# Patient Record
Sex: Female | Born: 1975 | Hispanic: Yes | Smoking: Current some day smoker
Health system: Southern US, Community
[De-identification: ages and names within clinical notes are randomized; demographics above are authoritative.]

## PROBLEM LIST (undated history)

## (undated) DIAGNOSIS — E119 Type 2 diabetes mellitus without complications: Secondary | ICD-10-CM

## (undated) DIAGNOSIS — E7800 Pure hypercholesterolemia, unspecified: Secondary | ICD-10-CM

## (undated) HISTORY — PX: OTHER SURGICAL HISTORY: SHX169

---

## 2009-08-18 ENCOUNTER — Emergency Department: Payer: Self-pay | Admitting: Emergency Medicine

## 2016-06-15 ENCOUNTER — Ambulatory Visit: Payer: Self-pay | Attending: Oncology

## 2016-06-15 ENCOUNTER — Ambulatory Visit
Admission: RE | Admit: 2016-06-15 | Discharge: 2016-06-15 | Disposition: A | Discharge status: Home / Self Care | Payer: Self-pay | Source: Ambulatory Visit | Attending: Oncology | Admitting: Oncology

## 2016-06-15 ENCOUNTER — Encounter (INDEPENDENT_AMBULATORY_CARE_PROVIDER_SITE_OTHER): Payer: Self-pay

## 2016-06-15 VITALS — BP 125/75 | HR 72 | Temp 96.9°F | Resp 18 | Ht 60.24 in | Wt 166.1 lb

## 2016-06-15 DIAGNOSIS — N6452 Nipple discharge: Secondary | ICD-10-CM

## 2016-06-15 NOTE — Progress Notes (Signed)
Subjective:     Patient ID: Stephanie Boyer, female   DOB: Jun 26, 1975, 41 y.o.   MRN: [id number]  HPI   Review of Systems     Objective:   Physical Exam  Pulmonary/Chest: Right breast exhibits nipple discharge. Right breast exhibits no inverted nipple, no mass, no skin change and no tenderness. Left breast exhibits nipple discharge. Left breast exhibits no inverted nipple, no mass, no skin change and no tenderness. Breasts are symmetrical.  Clear-straw colored bilateral nipple discharge       Assessment:     41 year old hispanic patient presents for [facility] clinic visit.  Patient screened, and meets [facility] eligibility.  Patient does not have insurance, Medicare or Medicaid.  Handout given on Affordable Care Act.  Instructed patient on breast self-exam using teach back method.  Patient complains of right nipple pain that caused her to express discharge. Also having left breast discharge on expression, but no pain.  Able to express clear to straw colored discharge bilateral.    Plan:     Sent for bilateral diagnostic mammogram, and ultrasound.

## 2016-06-28 NOTE — Progress Notes (Signed)
Radiologist reported negative mammogram findings to patient.  Letter mailed from [facility] to notify of normal mammogram results.  Patient to return in one year for annual screening Copy to [facility].

## 2017-09-09 ENCOUNTER — Encounter: Payer: Self-pay | Admitting: Emergency Medicine

## 2017-09-09 ENCOUNTER — Emergency Department
Admission: EM | Admit: 2017-09-09 | Discharge: 2017-09-09 | Disposition: A | Discharge status: Home / Self Care | Payer: Self-pay | Attending: Emergency Medicine | Admitting: Emergency Medicine

## 2017-09-09 ENCOUNTER — Emergency Department: Payer: Self-pay

## 2017-09-09 ENCOUNTER — Other Ambulatory Visit: Payer: Self-pay

## 2017-09-09 DIAGNOSIS — F17200 Nicotine dependence, unspecified, uncomplicated: Secondary | ICD-10-CM | POA: Insufficient documentation

## 2017-09-09 DIAGNOSIS — M25552 Pain in left hip: Secondary | ICD-10-CM

## 2017-09-09 DIAGNOSIS — M5417 Radiculopathy, lumbosacral region: Secondary | ICD-10-CM | POA: Insufficient documentation

## 2017-09-09 LAB — POCT PREGNANCY, URINE: Preg Test, Ur: NEGATIVE

## 2017-09-09 MED ORDER — ORPHENADRINE CITRATE 30 MG/ML IJ SOLN
60.0000 mg | Freq: Two times a day (BID) | INTRAMUSCULAR | Status: DC
  Administered 2017-09-09: 60 mg via INTRAMUSCULAR
  Filled 2017-09-09: qty 2

## 2017-09-09 MED ORDER — LIDOCAINE 5 % EX PTCH
1.0000 | MEDICATED_PATCH | CUTANEOUS | Status: DC
  Administered 2017-09-09: 1 via TRANSDERMAL
  Filled 2017-09-09: qty 1

## 2017-09-09 MED ORDER — KETOROLAC TROMETHAMINE 10 MG PO TABS
10.0000 mg | ORAL_TABLET | Freq: Four times a day (QID) | ORAL | 0 refills | Status: AC | PRN
Start: 2017-09-09 — End: ?

## 2017-09-09 MED ORDER — LIDOCAINE 5 % EX PTCH: 1.0000 | MEDICATED_PATCH | Freq: Two times a day (BID) | CUTANEOUS | 0 refills | Status: AC

## 2017-09-09 MED ORDER — CYCLOBENZAPRINE HCL 5 MG PO TABS: ORAL_TABLET | ORAL | 0 refills | Status: AC

## 2017-09-09 MED ORDER — KETOROLAC TROMETHAMINE 30 MG/ML IJ SOLN
30.0000 mg | Freq: Once | INTRAMUSCULAR | Status: AC
  Administered 2017-09-09: 30 mg via INTRAMUSCULAR
  Filled 2017-09-09: qty 1

## 2017-09-09 MED ORDER — OXYCODONE-ACETAMINOPHEN 5-325 MG PO TABS
1.0000 | ORAL_TABLET | Freq: Once | ORAL | Status: AC
  Administered 2017-09-09: 1 via ORAL
  Filled 2017-09-09: qty 1

## 2017-09-09 MED ORDER — TRAMADOL HCL 50 MG PO TABS: 50.0000 mg | ORAL_TABLET | Freq: Four times a day (QID) | ORAL | 0 refills | Status: AC | PRN

## 2017-09-09 NOTE — ED Notes (Signed)
This RN called XR to inform them pt's POC is negative and was entered approx 1144. Aware. Per radiology, pt is next on the list for XR.

## 2017-09-09 NOTE — ED Notes (Signed)
Patient transported to XR. 

## 2017-09-09 NOTE — ED Triage Notes (Addendum)
States pian L hip radiating down L leg x 2 weeks with no fall or injury.

## 2017-09-09 NOTE — ED Provider Notes (Signed)
[facility] Emergency Department Provider Note  ____________________________________________  Time seen: Approximately 10:54 AM  I have reviewed the triage vital signs and the nursing notes.   HISTORY  Chief Complaint Knee Pain and Hip Pain    HPI Stephanie Boyer is a 42 y.o. female that presents to the emergency department for evaluation of left hip pain that radiates into front of left thigh for 2 weeks. She has had some occasional numbness in her toes. No trauma.  No bowel or bladder dysfunction or saddle paresthesias.  Patient denies abdominal pain, back pain, dysuria, urgency, frequency.   History reviewed. No pertinent past medical history.  There are no active problems to display for this patient.   Past Surgical History:  Procedure Laterality Date  . hypercholesterolemia      Prior to Admission medications   Medication Sig Start Date End Date Taking? Authorizing Provider  cyclobenzaprine (FLEXERIL) 5 MG tablet Take 1-2 tablets 3 times daily as needed 09/09/17   Enid DerryWagner, Blayke Pinera, PA-C  ketorolac (TORADOL) 10 MG tablet Take 1 tablet (10 mg total) by mouth every 6 (six) hours as needed. 09/09/17   Enid DerryWagner, Denver Bentson, PA-C  lidocaine (LIDODERM) 5 % Place 1 patch onto the skin every 12 (twelve) hours. Remove & Discard patch within 12 hours or as directed by MD 09/09/17 09/09/18  Enid DerryWagner, Arminta Gamm, PA-C  traMADol (ULTRAM) 50 MG tablet Take 1 tablet (50 mg total) by mouth every 6 (six) hours as needed. 09/09/17 09/09/18  Enid DerryWagner, Volney Reierson, PA-C    Allergies Patient has no known allergies.  No family history on file.  Social History Social History   Tobacco Use  . Smoking status: Current Some Day Smoker  Substance Use Topics  . Alcohol use: Not on file  . Drug use: Not on file     Review of Systems  Constitutional: No fever/chills Cardiovascular: No chest pain. Respiratory: No SOB. Gastrointestinal: No abdominal pain.  No nausea, no vomiting.   Genitourinary: Negative for dysuria. Musculoskeletal: Positive for hip pain. Negative for back pain.  Skin: Negative for rash, abrasions, lacerations, ecchymosis. Neurological: Negative for headaches  ____________________________________________   PHYSICAL EXAM:  VITAL SIGNS: ED Triage Vitals  Enc Vitals Group     BP 09/09/17 0955 (!) 152/85     Pulse Rate 09/09/17 0955 75     Resp 09/09/17 0955 18     Temp 09/09/17 0955 98 F (36.7 C)     Temp Source 09/09/17 0955 Oral     SpO2 09/09/17 0955 98 %     Weight 09/09/17 0956 169 lb (76.7 kg)     Height 09/09/17 0956 5' (1.524 m)     Head Circumference --      Peak Flow --      Pain Score 09/09/17 0956 9     Pain Loc --      Pain Edu? --      Excl. in GC? --      Constitutional: Alert and oriented. Well appearing and in no acute distress. Eyes: Conjunctivae are normal. PERRL. EOMI. Head: Atraumatic. ENT:      Ears:      Nose: No congestion/rhinnorhea.      Mouth/Throat: Mucous membranes are moist.  Neck: No stridor.  Cardiovascular: Normal rate, regular rhythm.  Good peripheral circulation. Respiratory: Normal respiratory effort without tachypnea or retractions. Lungs CTAB. Good air entry to the bases with no decreased or absent breath sounds. Gastrointestinal: Bowel sounds 4 quadrants. Soft and nontender to  palpation. No guarding or rigidity. No palpable masses. No distention. No CVA tenderness. Musculoskeletal: Full range of motion to all extremities. No gross deformities appreciated.  No tenderness to palpation over lumbar spine.  Tenderness to palpation of her left hip and left lumbar paraspinal muscles.  Full range of motion of bilateral hips.  Strength equal in lower extremity's bilaterally.  Negative straight leg raise. Neurologic:  Normal speech and language. No gross focal neurologic deficits are appreciated.  Skin:  Skin is warm, dry and intact. No rash noted. Psychiatric: Mood and affect are normal. Speech and  behavior are normal. Patient exhibits appropriate insight and judgement.   ____________________________________________   LABS (all labs ordered are listed, but only abnormal results are displayed)  Labs Reviewed  POC URINE PREG, ED  POCT PREGNANCY, URINE   ____________________________________________  EKG   ____________________________________________  RADIOLOGY Lexine Baton, personally viewed and evaluated these images (plain radiographs) as part of my medical decision making, as well as reviewing the written report by the radiologist.  Dg Hip Unilat W Or Wo Pelvis 2-3 Views Left  Result Date: 09/09/2017 CLINICAL DATA:  Pain with left-sided radicular symptoms EXAM: DG HIP (WITH OR WITHOUT PELVIS) 2-3V LEFT COMPARISON:  None. FINDINGS: Frontal pelvis as well as frontal and lateral left hip images were obtained. No fracture or dislocation. Joint spaces appear normal. No erosive change. IMPRESSION: No fracture or dislocation.  No evident arthropathy. Electronically Signed   By: Bretta Bang III M.D.   On: 09/09/2017 12:53    ____________________________________________    PROCEDURES  Procedure(s) performed:    Procedures    Medications  lidocaine (LIDODERM) 5 % 1 patch (1 patch Transdermal Patch Applied 09/09/17 1119)  orphenadrine (NORFLEX) injection 60 mg (60 mg Intramuscular Given 09/09/17 1341)  oxyCODONE-acetaminophen (PERCOCET/ROXICET) 5-325 MG per tablet 1 tablet (1 tablet Oral Given 09/09/17 1117)  ketorolac (TORADOL) 30 MG/ML injection 30 mg (30 mg Intramuscular Given 09/09/17 1338)     ____________________________________________   INITIAL IMPRESSION / ASSESSMENT AND PLAN / ED COURSE  Pertinent labs & imaging results that were available during my care of the patient were reviewed by me and considered in my medical decision making (see chart for details).  Review of the [place] CSRS was performed in accordance of the [facility] prior to dispensing any controlled  drugs.     Patient's diagnosis is consistent with hip pain and radiculopathy. Vital signs and exam are reassuring.  Hip x-ray negative for acute bony abnormalities. Patient states the pain completely resolved with Percocet, Toradol, Norflex. Patient will be discharged home with prescriptions for toradol, flexeril, tramadol, lidoderm. Patient is to follow up with PCP as directed. Patient is given ED precautions to return to the ED for any worsening or new symptoms.     ____________________________________________  FINAL CLINICAL IMPRESSION(S) / ED DIAGNOSES  Final diagnoses:  Left hip pain  Lumbosacral radiculopathy      NEW MEDICATIONS STARTED DURING THIS VISIT:  ED Discharge Orders        Ordered    ketorolac (TORADOL) 10 MG tablet  Every 6 hours PRN     09/09/17 1413    cyclobenzaprine (FLEXERIL) 5 MG tablet     09/09/17 1413    traMADol (ULTRAM) 50 MG tablet  Every 6 hours PRN     09/09/17 1413    lidocaine (LIDODERM) 5 %  Every 12 hours     09/09/17 1413          This chart was  dictated using voice recognition software/Dragon. Despite best efforts to proofread, errors can occur which can change the meaning. Any change was purely unintentional.    Enid Derry, PA-C 09/09/17 1557    Pershing Proud Myra Rude, MD 09/12/17 2142

## 2019-01-16 ENCOUNTER — Other Ambulatory Visit: Payer: Self-pay

## 2019-01-16 DIAGNOSIS — Z20822 Contact with and (suspected) exposure to covid-19: Secondary | ICD-10-CM

## 2019-01-18 LAB — NOVEL CORONAVIRUS, NAA: SARS-CoV-2, NAA: DETECTED — AB

## 2019-01-30 ENCOUNTER — Other Ambulatory Visit: Payer: Self-pay

## 2019-01-30 DIAGNOSIS — Z20822 Contact with and (suspected) exposure to covid-19: Secondary | ICD-10-CM

## 2019-02-01 LAB — NOVEL CORONAVIRUS, NAA: SARS-CoV-2, NAA: NOT DETECTED

## 2020-02-14 ENCOUNTER — Other Ambulatory Visit: Payer: Self-pay | Admitting: Family Medicine

## 2020-02-14 DIAGNOSIS — Z1231 Encounter for screening mammogram for malignant neoplasm of breast: Secondary | ICD-10-CM

## 2020-02-20 ENCOUNTER — Ambulatory Visit: Payer: No Typology Code available for payment source

## 2020-02-20 ENCOUNTER — Other Ambulatory Visit: Payer: Self-pay

## 2020-04-02 ENCOUNTER — Ambulatory Visit
Admission: RE | Admit: 2020-04-02 | Discharge: 2020-04-02 | Disposition: A | Discharge status: Home / Self Care | Payer: No Typology Code available for payment source | Source: Ambulatory Visit | Attending: Family Medicine | Admitting: Family Medicine

## 2020-04-02 ENCOUNTER — Other Ambulatory Visit: Payer: Self-pay

## 2020-04-02 DIAGNOSIS — Z1231 Encounter for screening mammogram for malignant neoplasm of breast: Secondary | ICD-10-CM

## 2021-05-01 ENCOUNTER — Other Ambulatory Visit: Payer: Self-pay

## 2021-05-01 ENCOUNTER — Encounter (HOSPITAL_COMMUNITY): Payer: Self-pay | Admitting: Emergency Medicine

## 2021-05-01 ENCOUNTER — Emergency Department (HOSPITAL_COMMUNITY)
Admission: EM | Admit: 2021-05-01 | Discharge: 2021-05-01 | Disposition: A | Discharge status: Home / Self Care | Payer: No Typology Code available for payment source | Attending: Emergency Medicine | Admitting: Emergency Medicine

## 2021-05-01 DIAGNOSIS — R748 Abnormal levels of other serum enzymes: Secondary | ICD-10-CM

## 2021-05-01 DIAGNOSIS — I10 Essential (primary) hypertension: Secondary | ICD-10-CM | POA: Insufficient documentation

## 2021-05-01 DIAGNOSIS — E873 Alkalosis: Secondary | ICD-10-CM | POA: Insufficient documentation

## 2021-05-01 DIAGNOSIS — E871 Hypo-osmolality and hyponatremia: Secondary | ICD-10-CM | POA: Insufficient documentation

## 2021-05-01 DIAGNOSIS — R739 Hyperglycemia, unspecified: Secondary | ICD-10-CM

## 2021-05-01 DIAGNOSIS — E1165 Type 2 diabetes mellitus with hyperglycemia: Secondary | ICD-10-CM | POA: Insufficient documentation

## 2021-05-01 DIAGNOSIS — F410 Panic disorder [episodic paroxysmal anxiety] without agoraphobia: Secondary | ICD-10-CM

## 2021-05-01 HISTORY — DX: Type 2 diabetes mellitus without complications: E11.9

## 2021-05-01 HISTORY — DX: Pure hypercholesterolemia, unspecified: E78.00

## 2021-05-01 LAB — I-STAT VENOUS BLOOD GAS, ED
Acid-Base Excess: 2 mmol/L (ref 0.0–2.0)
Bicarbonate: 25.2 mmol/L (ref 20.0–28.0)
Calcium, Ion: 1.17 mmol/L (ref 1.15–1.40)
HCT: 42 % (ref 36.0–46.0)
Hemoglobin: 14.3 g/dL (ref 12.0–15.0)
O2 Saturation: 98 %
Potassium: 3.6 mmol/L (ref 3.5–5.1)
Sodium: 134 mmol/L — ABNORMAL LOW (ref 135–145)
TCO2: 26 mmol/L (ref 22–32)
pCO2, Ven: 33 mmHg — ABNORMAL LOW (ref 44–60)
pH, Ven: 7.49 — ABNORMAL HIGH (ref 7.25–7.43)
pO2, Ven: 90 mmHg — ABNORMAL HIGH (ref 32–45)

## 2021-05-01 LAB — I-STAT BETA HCG BLOOD, ED (MC, WL, AP ONLY): I-stat hCG, quantitative: <5 m[IU]/mL (ref <5)

## 2021-05-01 LAB — I-STAT CHEM 8, ED
BUN: 12 mg/dL (ref 6–20)
Calcium, Ion: 1.16 mmol/L (ref 1.15–1.40)
Chloride: 101 mmol/L (ref 98–111)
Creatinine, Ser: 0.3 mg/dL — ABNORMAL LOW (ref 0.44–1.00)
Glucose, Bld: 362 mg/dL — ABNORMAL HIGH (ref 70–99)
HCT: 43 % (ref 36.0–46.0)
Hemoglobin: 14.6 g/dL (ref 12.0–15.0)
Potassium: 3.7 mmol/L (ref 3.5–5.1)
Sodium: 135 mmol/L (ref 135–145)
TCO2: 25 mmol/L (ref 22–32)

## 2021-05-01 LAB — CBC WITH DIFFERENTIAL/PLATELET
Abs Immature Granulocytes: 0.03 10*3/uL (ref 0.00–0.07)
Basophils Absolute: 0.1 10*3/uL (ref 0.0–0.1)
Basophils Relative: 1 %
Eosinophils Absolute: 1.6 10*3/uL — ABNORMAL HIGH (ref 0.0–0.5)
Eosinophils Relative: 18 %
HCT: 41.6 % (ref 36.0–46.0)
Hemoglobin: 15.1 g/dL — ABNORMAL HIGH (ref 12.0–15.0)
Immature Granulocytes: 0 %
Lymphocytes Relative: 38 %
Lymphs Abs: 3.5 10*3/uL (ref 0.7–4.0)
MCH: 30.1 pg (ref 26.0–34.0)
MCHC: 36.3 g/dL — ABNORMAL HIGH (ref 30.0–36.0)
MCV: 83 fL (ref 80.0–100.0)
Monocytes Absolute: 0.5 10*3/uL (ref 0.1–1.0)
Monocytes Relative: 5 %
Neutro Abs: 3.5 10*3/uL (ref 1.7–7.7)
Neutrophils Relative %: 38 %
Platelets: 308 10*3/uL (ref 150–400)
RBC: 5.01 MIL/uL (ref 3.87–5.11)
RDW: 12.6 % (ref 11.5–15.5)
WBC: 9.2 10*3/uL (ref 4.0–10.5)
nRBC: 0 % (ref 0.0–0.2)

## 2021-05-01 LAB — COMPREHENSIVE METABOLIC PANEL
ALT: 89 U/L — ABNORMAL HIGH (ref 0–44)
AST: 82 U/L — ABNORMAL HIGH (ref 15–41)
Albumin: 3.8 g/dL (ref 3.5–5.0)
Alkaline Phosphatase: 103 U/L (ref 38–126)
Anion gap: 13 (ref 5–15)
BUN: 12 mg/dL (ref 6–20)
CO2: 19 mmol/L — ABNORMAL LOW (ref 22–32)
Calcium: 9.1 mg/dL (ref 8.9–10.3)
Chloride: 99 mmol/L (ref 98–111)
Creatinine, Ser: 0.67 mg/dL (ref 0.44–1.00)
GFR, Estimated: >60 mL/min (ref >60)
Glucose, Bld: 343 mg/dL — ABNORMAL HIGH (ref 70–99)
Potassium: 3.9 mmol/L (ref 3.5–5.1)
Sodium: 131 mmol/L — ABNORMAL LOW (ref 135–145)
Total Bilirubin: 1.3 mg/dL — ABNORMAL HIGH (ref 0.3–1.2)
Total Protein: 6.8 g/dL (ref 6.5–8.1)

## 2021-05-01 MED ORDER — KETOROLAC TROMETHAMINE 15 MG/ML IJ SOLN
30.0000 mg | Freq: Once | INTRAMUSCULAR | Status: AC
  Administered 2021-05-01: 30 mg via INTRAVENOUS
  Filled 2021-05-01: qty 2

## 2021-05-01 MED ORDER — SODIUM CHLORIDE 0.9 % IV BOLUS
500.0000 mL | Freq: Once | INTRAVENOUS | Status: AC
  Administered 2021-05-01: 500 mL via INTRAVENOUS

## 2021-05-01 MED ORDER — HYDROXYZINE HCL 25 MG PO TABS: 25.0000 mg | ORAL_TABLET | Freq: Four times a day (QID) | ORAL | 0 refills | Status: AC

## 2021-05-01 MED ORDER — LORAZEPAM 2 MG/ML IJ SOLN
0.5000 mg | Freq: Once | INTRAMUSCULAR | Status: AC
  Administered 2021-05-01: 0.5 mg via INTRAVENOUS
  Filled 2021-05-01: qty 1

## 2021-05-01 NOTE — Discharge Instructions (Addendum)
Por favor, haga un seguimiento con su proveedor de atencin primaria. Debe obtener una cita dentro de la prxima semana para hablar sobre sus anlisis de hgado elevados y su presin arterial. Tambin deberan poder reponer su medicamento para la presin arterial en esa visita.  La medicacin de la farmacia es para la ansiedad. Solo debe tomar esto cuando tenga un ataque de pnico. Por favor mantenga su cita con su consejero tambin. Regrese al departamento de emergencias si los sntomas empeoran.

## 2021-05-01 NOTE — ED Triage Notes (Signed)
Pt BIB [facility] from home, pt found in bed with eyes open and not responding but alert. EMS reports eyes flickering on arrival with heavy breathing, no neuro deficits noted. Hx type II diabetes, compliant with meds. CBG 360. Pt became tearful and became slightly more able to follow commands, and continues to improve. Attempting to answer questions, Spanish speaking only.   EMS VS- 190/100, HR 80, 100% RA

## 2021-05-01 NOTE — ED Provider Notes (Signed)
[facility] EMERGENCY DEPARTMENT Provider Note   CSN: [id number] Arrival date & time: 05/01/21  2129     History No chief complaint on file.   Stephanie Boyer is a 46 y.o. female with medical history of hypertension, diabetes, hyperlipidemia, recently diagnosed UTI on antibiotics presenting today after a reported panic attack.  History was obtained via [place] interpreter.  She reports that she was talking with her husband when all of a sudden she started to feel warm, anxious and teary.  She does not remember what happened after this however knows that her husband called EMS because she was not responding to him very well.  Currently complains of a headache, no visual disturbances, no chest pain, dizziness or shortness of breath.  She reports that she has a history of anxiety attacks that have become worse since her father passed away few months ago and her sister's funeral yesterday.  She believes that this episode was purely due to her nerves.  Of note, on Monday patient presented to her primary care provider with chest discomfort and arm pain.  She had basic lab work done and was told that her blood sugars continue to be poorly controlled.  An additional oral diabetes medication was added however she does not know the name.  At the same time, they told her that her liver or gallbladder labs were somewhat abnormal but she does not remember which or what the diagnoses were.  She is supposed to follow-up with her primary care provider on March 30.  On March 2 she has an appointment with a nutritionist and a counselor to better treat her chronic diabetes and anxiety.  Home Medications Prior to Admission medications   Medication Sig Start Date End Date Taking? Authorizing Provider  cyclobenzaprine (FLEXERIL) 5 MG tablet Take 1-2 tablets 3 times daily as needed 09/09/17   Laban Emperor, PA-C  ketorolac (TORADOL) 10 MG tablet Take 1 tablet (10 mg total) by mouth every 6 (six)  hours as needed. 09/09/17   Laban Emperor, PA-C      Allergies    Patient has no known allergies.    Review of Systems   Review of Systems  Neurological:  Positive for headaches. Negative for dizziness.  Psychiatric/Behavioral:  The patient is nervous/anxious.   See HPI for more  Physical Exam Updated Vital Signs BP (!) 151/106    Pulse 82    Temp 97.9 F (36.6 C) (Oral)    Resp 20    SpO2 96%  Physical Exam Vitals and nursing note reviewed.  Constitutional:      General: She is not in acute distress.    Appearance: Normal appearance. She is not ill-appearing.  HENT:     Head: Normocephalic and atraumatic.  Eyes:     General: No scleral icterus.    Extraocular Movements: Extraocular movements intact.     Conjunctiva/sclera: Conjunctivae normal.     Pupils: Pupils are equal, round, and reactive to light.  Cardiovascular:     Rate and Rhythm: Normal rate and regular rhythm.  Pulmonary:     Effort: Pulmonary effort is normal. No respiratory distress.     Breath sounds: No wheezing.  Abdominal:     General: Abdomen is flat.     Palpations: Abdomen is soft.     Tenderness: There is no abdominal tenderness.  Skin:    Findings: No rash.  Neurological:     Mental Status: She is alert and oriented to person, place, and  time.     Cranial Nerves: No cranial nerve deficit.     Motor: No weakness.     Coordination: Coordination normal.     Comments: 5 out of 5 strength in bilateral upper and lower extremities.  Finger-nose and heel shin within normal limits.  EOMs intact.  Cranial nerves II through XII grossly intact.  Psychiatric:        Mood and Affect: Mood normal.     Comments: Teary and anxious    ED Results / Procedures / Treatments   Labs (all labs ordered are listed, but only abnormal results are displayed) Labs Reviewed  CBC WITH DIFFERENTIAL/PLATELET - Abnormal; Notable for the following components:      Result Value   Hemoglobin 15.1 (*)    MCHC 36.3 (*)     Eosinophils Absolute 1.6 (*)    All other components within normal limits  COMPREHENSIVE METABOLIC PANEL - Abnormal; Notable for the following components:   Sodium 131 (*)    CO2 19 (*)    Glucose, Bld 343 (*)    AST 82 (*)    ALT 89 (*)    Total Bilirubin 1.3 (*)    All other components within normal limits  I-STAT CHEM 8, ED - Abnormal; Notable for the following components:   Creatinine, Ser 0.30 (*)    Glucose, Bld 362 (*)    All other components within normal limits  I-STAT VENOUS BLOOD GAS, ED - Abnormal; Notable for the following components:   pH, Ven 7.490 (*)    pCO2, Ven 33.0 (*)    pO2, Ven 90 (*)    Sodium 134 (*)    All other components within normal limits  I-STAT BETA HCG BLOOD, ED ([facility], [facility], [facility] ONLY)    EKG EKG Interpretation  Date/Time:  Saturday May 01 2021 22:08:33 EST Ventricular Rate:  74 PR Interval:  152 QRS Duration: 90 QT Interval:  393 QTC Calculation: 436 R Axis:   46 Text Interpretation: Sinus rhythm No significant change since last tracing Confirmed by Gareth Morgan [phone]) on 05/01/2021 11:32:07 PM  Radiology No results found.  Procedures Procedures  Normal sinus rhythm with a normal rate  Medications Ordered in ED Medications  sodium chloride 0.9 % bolus 500 mL (0 mLs Intravenous Stopped 05/01/21 2308)  ketorolac (TORADOL) 15 MG/ML injection 30 mg (30 mg Intravenous Given 05/01/21 2206)  LORazepam (ATIVAN) injection 0.5 mg (0.5 mg Intravenous Given 05/01/21 2207)    ED Course/ Medical Decision Making/ A&P                           Medical Decision Making Amount and/or Complexity of Data Reviewed Labs: ordered.  Risk Prescription drug management.   46 year old female presenting today after a.  Decreased awareness.  At the time of EMS arrival there was reasonable concern for her decreased responsiveness and elevated blood glucose (300s) however after further evaluation of the patient, I believe there was a language barrier.   Patient does have an extensive history of type 2 diabetes that she is struggling to get under control.  She reports that her blood sugars have been running between the 200s and 400s however she sees her primary care provider regularly who is assisting with this.  All of her records are external and I am unable to see them.  Today, patient's history and presentation is more consistent with a panic attack.  She is under a large amount of  stress due to multiple family losses and a burial of her sibling yesterday.  Work-up: I reviewed the patient's labs and the results include glucose 362, sodium 131 and AST/ALT both in the 80s.  Treatment: Patient was given IVF, Toradol and Ativan to treat her mild hyponatremia, headache and anxiety.  On reassessment, she says that she feels much better.  I discontinued patient's BHB and UA.  These were ordered and there was a concern for DKA/HHS.  I-STAT VBG was already in process.  I did review these results which showed a mild alkalosis with a pH of 7.49 however I am not overly concerned about these results.   MDM/disposition: At this time patient is resting comfortably on the stretcher denying any symptoms.  She says she feels much better and is requesting something for her anxiety at home.  I have sent hydroxyzine to her pharmacy we discussed proper use.  She also has her follow-up with her counselor in a week.   Patient's blood pressure was elevated on arrival which the husband finds concerning.  She reportedly ran out of her antihypertensives a few days ago and will not have any for a month.  She is unsure what medications she takes and does not have a list.  I am unable to see them in chart review.  Throughout her stay today her blood pressures have been within normal limits.  I will not start new medication due to patient's multiple  chronic conditions and my inability to see what medication she is already on.  We discussed the importance of her following up with them  about her anxiety and potential medication management because her counselor will not be able to prescribe medications as she is requesting.  Patient also will speak with her primary care provider about her elevated liver enzymes.  Through history taking, patient says that this was found at her appointment last week as well and that she is starting medication for some of the abnormal labs.  She appears to have good follow-up with primary care and I do not believe this warrants any further work-up or referral.  To be discharged with her husband at this time with strict return precautions.  Final Clinical Impression(s) / ED Diagnoses Final diagnoses:  Elevated liver enzymes  Panic attack  Hyperglycemia    Rx / DC Orders ED Discharge Orders          Ordered    hydrOXYzine (ATARAX) 25 MG tablet  Every 6 hours        05/01/21 2323           Results and diagnoses were explained to the patient. Return precautions discussed in full. Patient had no additional questions and expressed complete understanding.   This chart was dictated using voice recognition software.  Despite best efforts to proofread,  errors can occur which can change the documentation meaning.     Darliss Ridgel 05/02/21 1754    Gareth Morgan, MD 05/03/21 [phone]

## 2021-05-01 NOTE — ED Notes (Addendum)
Translator utilized to get more information from pt. Pt reports she was massaging her husband when she began to feel "exasperated" and felt a "nerve attack", reporting SOB and possible LOC, states she is unsure of what time this event happened but knows she felt normal at 7 or 8 pm today. Pt endorses a headache. Pt states the headache started yesterday and that she also felt a stabbing to her chest with R arm numbness, denies this feeling at this time but states she felt arm numbness earlier today. Pt also endorses being diagnosed with UTI last week, states she is taking abx as prescribed.

## 2022-09-18 IMAGING — MG MM DIGITAL SCREENING BILAT W/ TOMO AND CAD
8 series · 8 of 24 positions shown · non-contrast
Comparison: None.

CLINICAL DATA: Screening.

EXAM:
DIGITAL SCREENING BILATERAL MAMMOGRAM WITH TOMO AND CAD

[R MLO synth-2D]
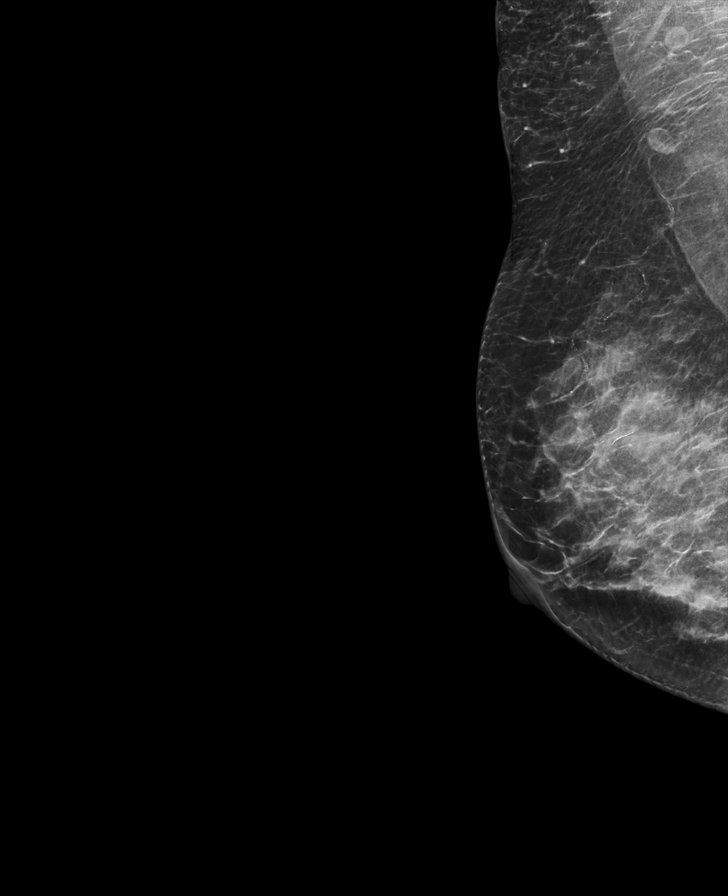

[R CC synth-2D]
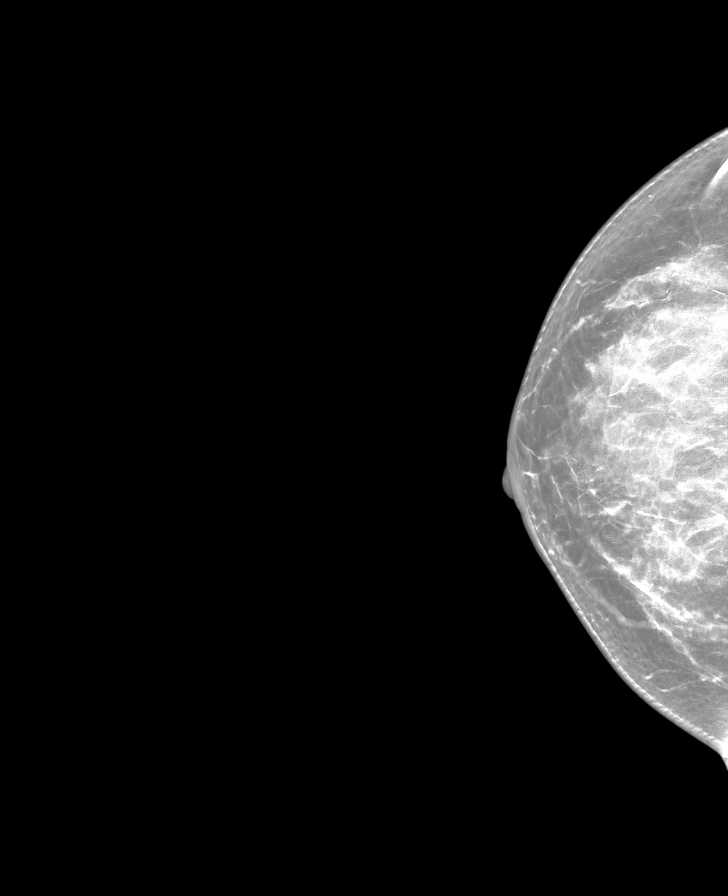

[L MLO synth-2D]
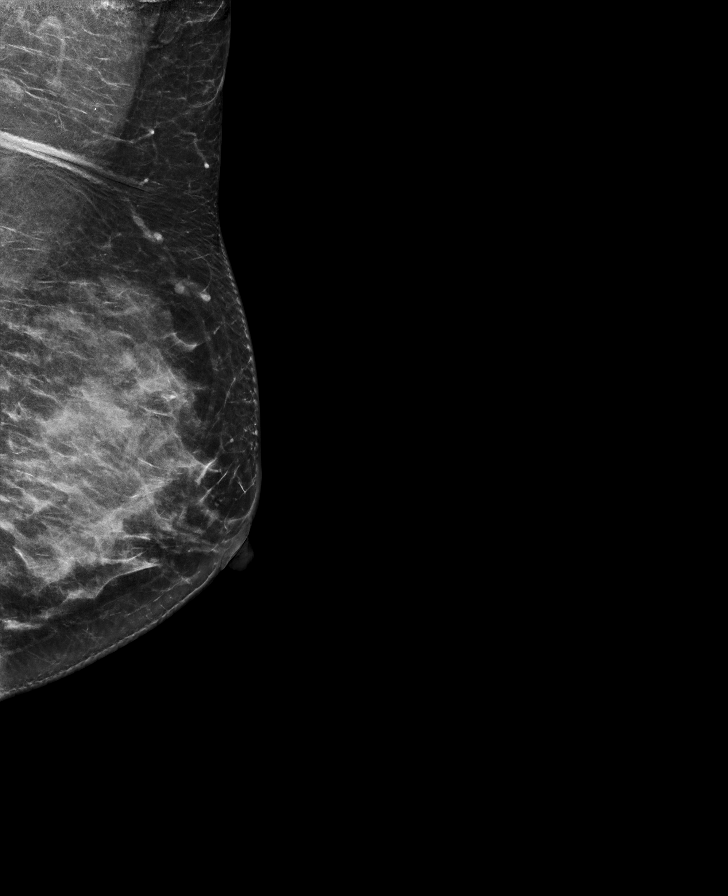

[L CC synth-2D]
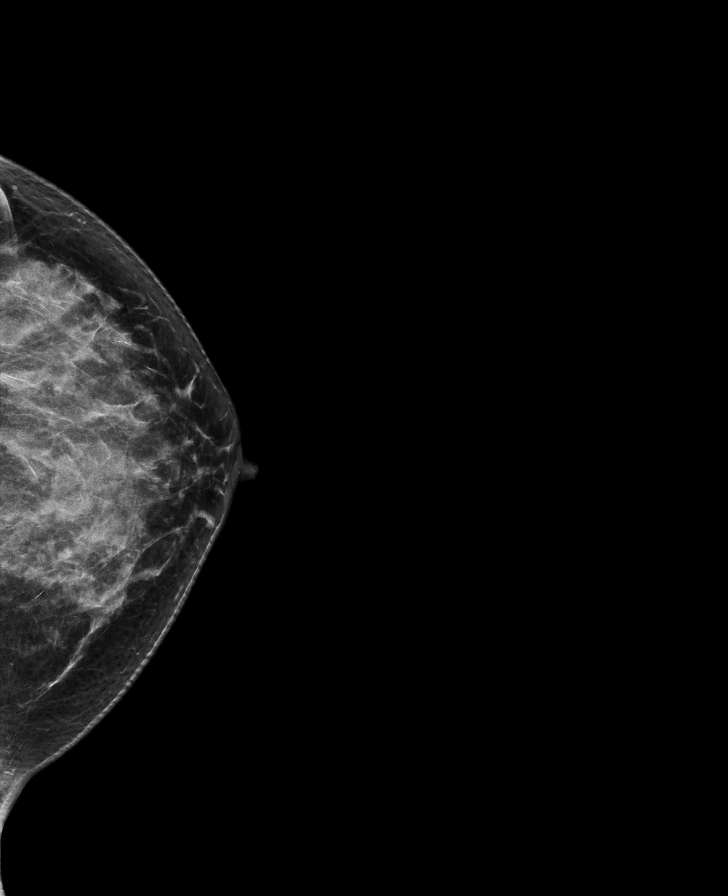

[R CC tomo · tomo slice 33/66.0]
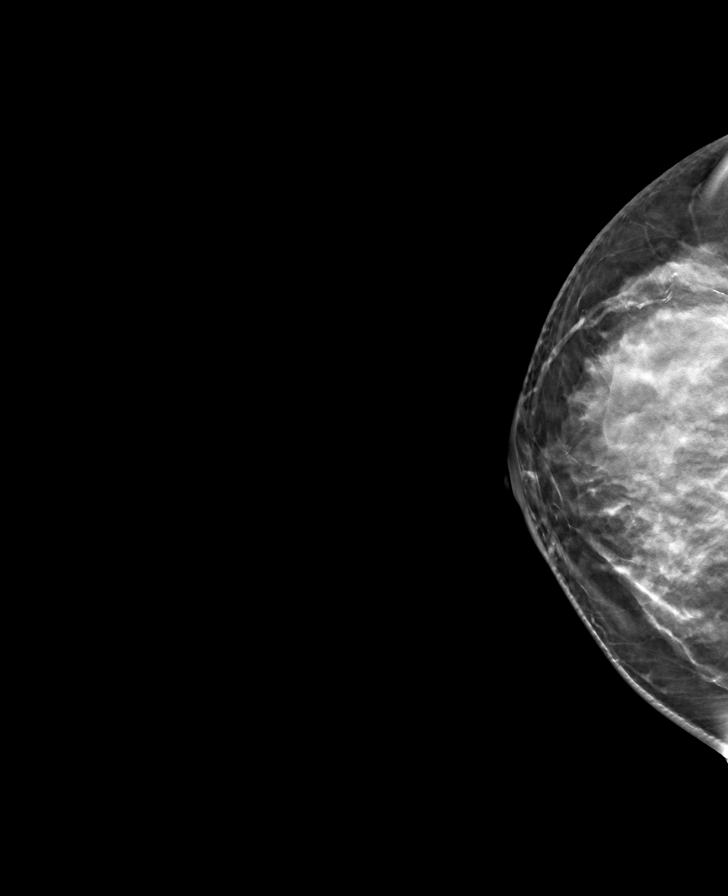

[R MLO tomo · tomo slice 35/69.0]
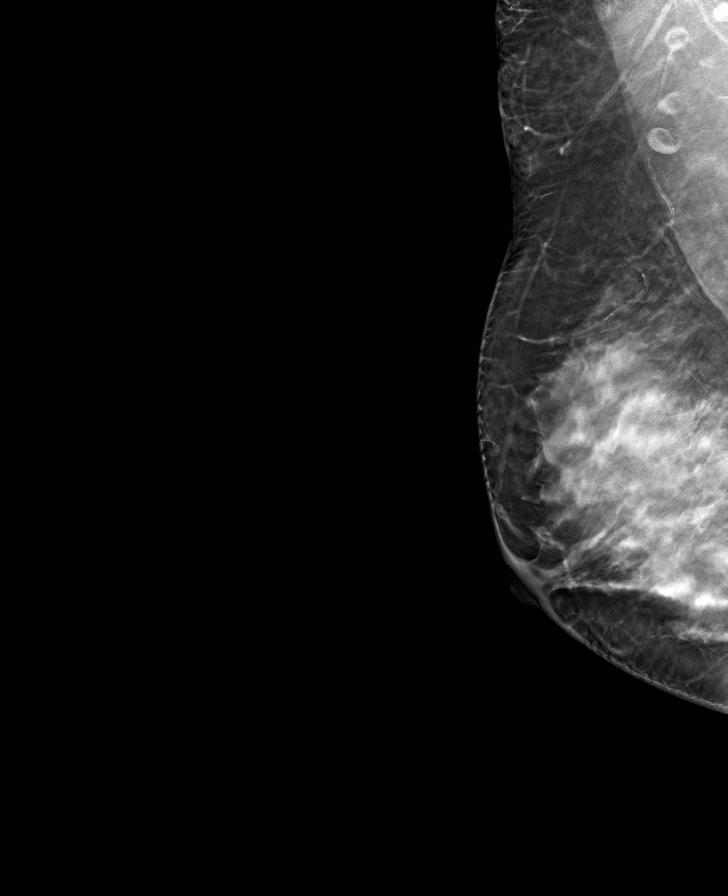

[L CC tomo · tomo slice 33/66.0]
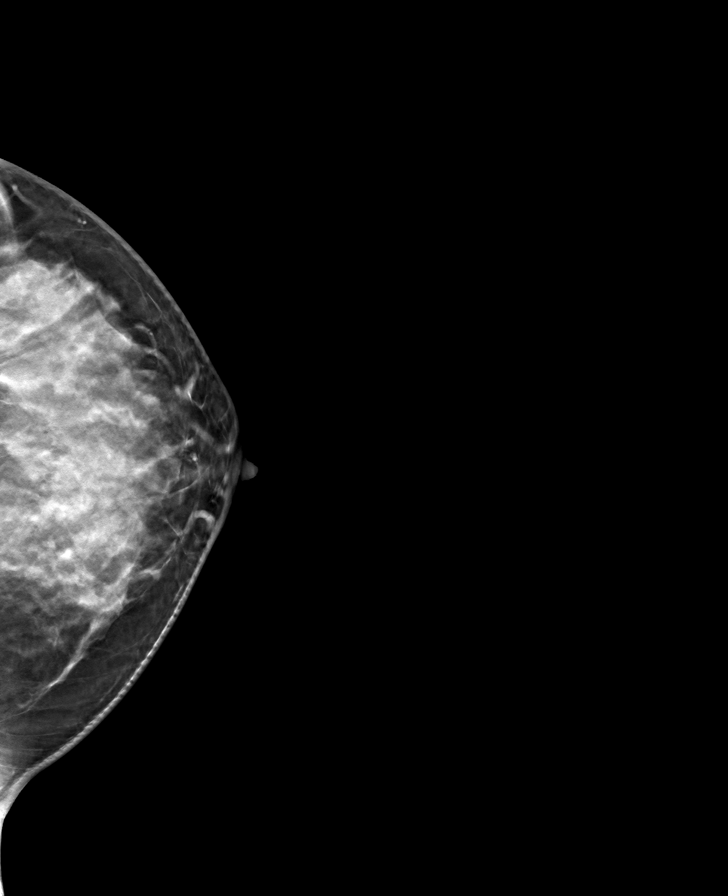

[L MLO tomo · tomo slice 35/70.0]
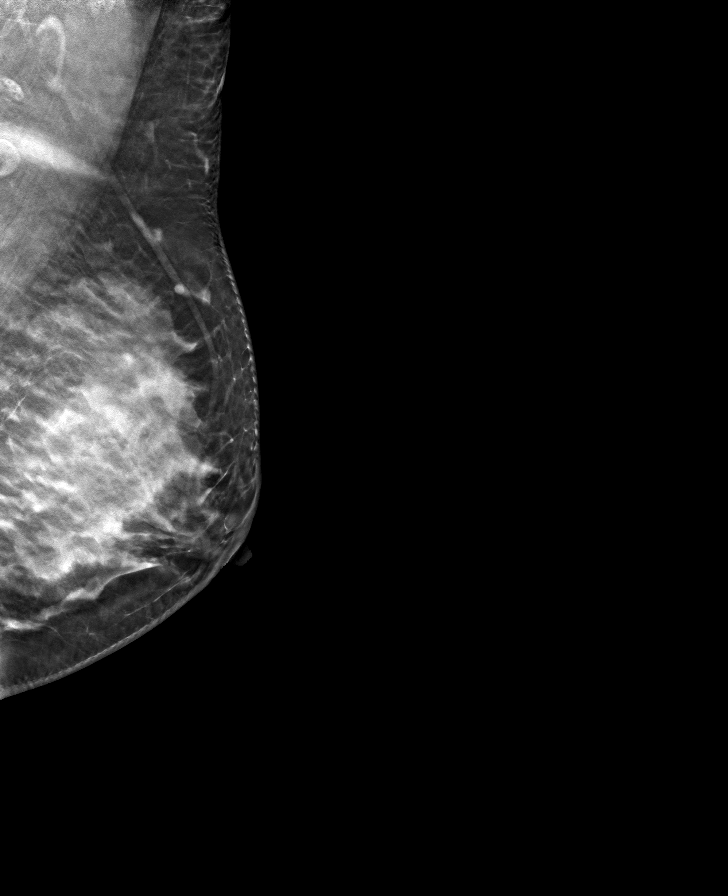

[8 of 24 positions shown; findings below may reference images not displayed]

ACR Breast Density Category c: The breast tissue is heterogeneously
dense, which may obscure small masses
FINDINGS: There are no findings suspicious for malignancy. The images were
evaluated with computer-aided detection.
IMPRESSION: No mammographic evidence of malignancy. A result letter of this
screening mammogram will be mailed directly to the patient.

RECOMMENDATION:
Screening mammogram in one year. (Code:FI-R-51M)

BI-RADS CATEGORY  1: Negative.
